# Patient Record
Sex: Male | Born: 1985 | Race: White | Hispanic: No | Marital: Married | Smoking: Current every day smoker
Health system: Southern US, Community
[De-identification: ages and names within clinical notes are randomized; demographics above are authoritative.]

## PROBLEM LIST (undated history)

## (undated) DIAGNOSIS — R12 Heartburn: Secondary | ICD-10-CM

## (undated) HISTORY — PX: FRACTURE SURGERY: SHX138

---
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
## 2006-07-05 ENCOUNTER — Emergency Department (HOSPITAL_COMMUNITY): Admission: EM | Admit: 2006-07-05 | Discharge: 2006-07-05 | Payer: Self-pay | Admitting: Emergency Medicine

## 2010-08-15 ENCOUNTER — Observation Stay (HOSPITAL_COMMUNITY)
Admission: AD | Admit: 2010-08-15 | Discharge: 2010-08-16 | Disposition: A | Discharge status: Home / Self Care | Payer: BC Managed Care – PPO | Source: Other Acute Inpatient Hospital | Attending: Internal Medicine | Admitting: Internal Medicine

## 2010-08-15 ENCOUNTER — Emergency Department (HOSPITAL_BASED_OUTPATIENT_CLINIC_OR_DEPARTMENT_OTHER)
Admission: EM | Admit: 2010-08-15 | Discharge: 2010-08-15 | Disposition: A | Discharge status: Nursing Home (Includes ICF) | Payer: BC Managed Care – PPO | Source: Home / Self Care | Attending: Emergency Medicine | Admitting: Emergency Medicine

## 2010-08-15 DIAGNOSIS — Y990 Civilian activity done for income or pay: Secondary | ICD-10-CM | POA: Insufficient documentation

## 2010-08-15 DIAGNOSIS — E860 Dehydration: Secondary | ICD-10-CM | POA: Insufficient documentation

## 2010-08-15 DIAGNOSIS — T673XXA Heat exhaustion, anhydrotic, initial encounter: Principal | ICD-10-CM | POA: Insufficient documentation

## 2010-08-15 DIAGNOSIS — Z01812 Encounter for preprocedural laboratory examination: Secondary | ICD-10-CM | POA: Insufficient documentation

## 2010-08-15 DIAGNOSIS — X30XXXA Exposure to excessive natural heat, initial encounter: Secondary | ICD-10-CM | POA: Insufficient documentation

## 2010-08-15 DIAGNOSIS — N179 Acute kidney failure, unspecified: Secondary | ICD-10-CM | POA: Insufficient documentation

## 2010-08-15 LAB — COMPREHENSIVE METABOLIC PANEL
ALT: 40 U/L (ref 0–53)
AST: 20 U/L (ref 0–37)
Albumin: 5.5 g/dL — ABNORMAL HIGH (ref 3.5–5.2)
Alkaline Phosphatase: 104 U/L (ref 39–117)
BUN: 39 mg/dL — ABNORMAL HIGH (ref 6–23)
CO2: 18 mEq/L — ABNORMAL LOW (ref 19–32)
Calcium: 9.7 mg/dL (ref 8.4–10.5)
Chloride: 96 mEq/L (ref 96–112)
Creatinine, Ser: 3.3 mg/dL — ABNORMAL HIGH (ref 0.50–1.35)
GFR calc Af Amer: 28 mL/min — ABNORMAL LOW (ref >60)
GFR calc non Af Amer: 23 mL/min — ABNORMAL LOW (ref >60)
Glucose, Bld: 141 mg/dL — ABNORMAL HIGH (ref 70–99)
Potassium: 3.5 mEq/L (ref 3.5–5.1)
Sodium: 136 mEq/L (ref 135–145)
Total Bilirubin: 0.4 mg/dL (ref 0.3–1.2)
Total Protein: 8.6 g/dL — ABNORMAL HIGH (ref 6.0–8.3)

## 2010-08-15 LAB — DIFFERENTIAL
Basophils Absolute: 0 10*3/uL (ref 0.0–0.1)
Basophils Relative: 0 % (ref 0–1)
Eosinophils Absolute: 0 10*3/uL (ref 0.0–0.7)
Eosinophils Relative: 0 % (ref 0–5)
Lymphocytes Relative: 5 % — ABNORMAL LOW (ref 12–46)
Lymphs Abs: 0.9 10*3/uL (ref 0.7–4.0)
Monocytes Absolute: 0.9 10*3/uL (ref 0.1–1.0)
Monocytes Relative: 5 % (ref 3–12)
Neutro Abs: 14.5 10*3/uL — ABNORMAL HIGH (ref 1.7–7.7)
Neutrophils Relative %: 89 % — ABNORMAL HIGH (ref 43–77)

## 2010-08-15 LAB — URINALYSIS, ROUTINE W REFLEX MICROSCOPIC
Glucose, UA: NEGATIVE mg/dL
Ketones, ur: 40 mg/dL — AB
Leukocytes, UA: NEGATIVE
Nitrite: NEGATIVE
Protein, ur: 100 mg/dL — AB
Specific Gravity, Urine: 1.024 (ref 1.005–1.030)
Urobilinogen, UA: 1 mg/dL (ref 0.0–1.0)
pH: 5.5 (ref 5.0–8.0)

## 2010-08-15 LAB — CBC
HCT: 46.2 % (ref 39.0–52.0)
Hemoglobin: 16.9 g/dL (ref 13.0–17.0)
MCH: 31.4 pg (ref 26.0–34.0)
MCHC: 36.6 g/dL — ABNORMAL HIGH (ref 30.0–36.0)
MCV: 85.7 fL (ref 78.0–100.0)
Platelets: 225 10*3/uL (ref 150–400)
RBC: 5.39 MIL/uL (ref 4.22–5.81)
RDW: 12.9 % (ref 11.5–15.5)
WBC: 16.2 10*3/uL — ABNORMAL HIGH (ref 4.0–10.5)

## 2010-08-15 LAB — URINE MICROSCOPIC-ADD ON

## 2010-08-15 LAB — CK: Total CK: 196 U/L (ref 7–232)

## 2010-08-16 LAB — CBC
HCT: 38.1 % — ABNORMAL LOW (ref 39.0–52.0)
Hemoglobin: 13.3 g/dL (ref 13.0–17.0)
RBC: 4.26 MIL/uL (ref 4.22–5.81)
WBC: 11.2 10*3/uL — ABNORMAL HIGH (ref 4.0–10.5)

## 2010-08-16 LAB — COMPREHENSIVE METABOLIC PANEL
Alkaline Phosphatase: 68 U/L (ref 39–117)
BUN: 25 mg/dL — ABNORMAL HIGH (ref 6–23)
Chloride: 106 mEq/L (ref 96–112)
GFR calc Af Amer: >60 mL/min (ref >60)
Glucose, Bld: 107 mg/dL — ABNORMAL HIGH (ref 70–99)
Potassium: 3.2 mEq/L — ABNORMAL LOW (ref 3.5–5.1)
Total Bilirubin: 0.4 mg/dL (ref 0.3–1.2)

## 2010-08-16 LAB — CK: Total CK: 390 U/L — ABNORMAL HIGH (ref 7–232)

## 2010-08-16 LAB — SODIUM, URINE, RANDOM: Sodium, Ur: 37 mEq/L

## 2010-08-17 LAB — DRUGS OF ABUSE SCREEN W/O ALC, ROUTINE URINE
Barbiturate Quant, Ur: NEGATIVE
Benzodiazepines.: NEGATIVE
Cocaine Metabolites: NEGATIVE
Marijuana Metabolite: POSITIVE — AB
Methadone: NEGATIVE
Opiate Screen, Urine: NEGATIVE

## 2010-08-19 NOTE — H&P (Signed)
NAMEELYAN, VANWIEREN                 ACCOUNT NO.:  [id number]  MEDICAL RECORD NO.:  [id number]  LOCATION:  3742                         FACILITY:  [facility]  PHYSICIAN:  Eduard Clos, MDDATE OF BIRTH:  November 20, 1985  DATE OF ADMISSION:  08/15/2010 DATE OF DISCHARGE:                             HISTORY & PHYSICAL   PRIMARY CARE PHYSICIAN:  Unassigned.  The patient does not have one.  CHIEF COMPLAINT:  Weakness.  HISTORY OF PRESENT ILLNESS:  A 25 year old male with no significant past medical history was brought into ER after the patient was found to be very weak.  The patient states that he usually works for [employer] and he has not worked for a year and started back again 3 days ago, he felt very tired, weak, started throwing up multiple times at least 3 times after his work and had gone home and while trying to get into his home he fell and almost lost his consciousness which happened again.  He felt very warm and hot sweating, so he fill the bathtub with ice water and he sat in it, then he called his mom and he was getting again spells of unconscious, feeling very tired and weak.  He did not bite his tongue or have any incontinence of urine to his knowledge, the whole body with aching and he also again vomited 2 or 3 times at home.  There is no blood in it.  No diarrhea.  He did have some abdominal cramps and he came to the ER.  In the ER, the patient was found to have creatinine of 3.3.  The patient does not have any previous history of  any renal problems.  At this time, it is felt that the patient may be having acute renal failure from his dehydration.  His creatine kinase was normal.  The patient denies any chest pain, shortness of breath.  Denies any headache or visual symptoms.  Did not lose function of the upper or lower extremity.  Did not have any diarrhea.  His abdominal pain was crampy in nature was episodic, which has completely resolved at this time.  His nausea  also has resolved.  He wants to eat something at this time.  PAST MEDICAL HISTORY:  Nothing significant.  MEDICATIONS ON ADMISSION:  He states he takes none.  SOCIAL HISTORY:  The patient smokes cigarettes, had been advised to quit smoking.  Drinks alcohol in the weekends, usually beer.  Denies any drug abuse.  He is married.  FAMILY HISTORY:  Negative for any diabetes, stroke, heart disease, cancer.  REVIEW OF SYSTEMS:  As per the history of present illness, nothing else significant.  PHYSICAL EXAMINATION:  GENERAL:  The patient examined at bedside, not in acute distress. VITAL SIGNS:  Blood pressure is 136/80, pulse is 86 per minute, temperature 98, respirations 18 per minute, O2 sat 97%. HEENT:  Anicteric.  No pallor.  No facial asymmetry.  Tongue is midline. No neck rigidity. CHEST:  Bilateral air entry present.  No rhonchi.  No crepitation. HEART:  S1 and S2 heard. ABDOMEN:  Soft, nontender.  Bowel sounds heard. CNS:  The patient is alert, awake and oriented to  time, place and person.  Moves upper and lower extremities, 5/5. EXTREMITIES:  Peripheral pulses felt.  No edema.  LABORATORY DATA:  CBC:  WBC 16.2, hemoglobin is 16.9, hematocrit is 46.2, platelets 225.  Complete metabolic panel, sodium 136, potassium 3.5, chloride 96, carbon dioxide 18, anion gap is 22, glucose 141, BUN 39, creatinine 3.3, total bilirubin is 0.4, alkaline phosphatase is 104, AST 20, ALT 40, total protein 8.6, albumin 5.5, calcium 9.7, CK is 196. UA is cloudy, negative glucose, bilirubin moderate, ketones 40, blood moderate, protein 100, nitrites negative, leukocytes negative, calcium oxalate crystals, hyaline casts, wbc's 3-6, rbc's 3-6, bacteria many, mucus present.  ASSESSMENT: 1. Acute renal failure. 2. Dehydration. 3. Nausea, vomiting and syncopal episodes, probably from acute renal     failure and dehydration reasons.  PLAN: 1. At this time, we will admit the patient to telemetry to  observe. 2. For his acute renal failure, at this time, I think it is from the     intense dehydration, nausea and vomiting.  At this time, his nausea     and vomiting has resolved.  He has no abdominal pain.  He actually     wants to eat something, I am going to start some clear liquid diet     and advance as tolerated.  We are going to hydrate the patient     aggressively with normal saline 250 mL per hour and closely follow     his intake and output.  I am also going to get an urine sodium and     creatinine.  I am going to recheck his labs in a.m.  He does have     significant leukocytosis, but at this time, he is afebrile, and     there is no definite clinical symptoms for any infective etiology.     We will recheck his CBC again in a.m. to make sure his leukocytosis     is improving.  At this time, I am not ordering any radiological     tests.  If his creatinine does not improve with adequate hydration,     we may consider further tests including sonogram of the kidneys.     We should keep a close watch on his leukocytosis also and further     recommendation based on test order and clinical course.     Eduard Clos, MD     ANK/MEDQ  D:  08/15/2010  T:  08/16/2010  Job:  [id number]  Electronically Signed by Midge Minium MD on 08/19/2010 07:37:28 AM

## 2010-08-28 NOTE — Discharge Summary (Signed)
  NAMEKENDRELL, [name]                 ACCOUNT NO.:  [id number]  MEDICAL RECORD NO.:  [id number]  LOCATION:  3742                         FACILITY:  [facility]  PHYSICIAN:  Osvaldo Shipper, MD     DATE OF BIRTH:  10-19-1985  DATE OF ADMISSION:  08/15/2010 DATE OF DISCHARGE:  08/16/2010                              DISCHARGE SUMMARY   The patient does not have a primary care physician.  No consultations obtained during this admission.  No imaging studies done.  DISCHARGE DIAGNOSES: 1. Acute renal failure due to dehydration improved. 2. Heat exhaustion improved.  BRIEF HOSPITAL COURSE:  Briefly, this is a 25 year old Caucasian male with no medical problems who was working outside in the sun yesterday and he got dizzy, lightheaded, started having nausea, vomiting. Apparently, he is working outdoors after a gap of more than a year.  So, the patient was thought to have some form of heat exhaustion.  When he was evaluated in the ED, he was found to have acute renal failure with a BUN of 39, creatinine of 3.3.  The patient was admitted to the hospital. He was started on IV fluids.  He was already feeling somewhat better while he was in the ED.  He was given a diet and the patient started improving.  His BUN is decreased to 25, creatinine is down to 1.11.  The patient is feeling better.  He has not had any nausea/vomiting.  He is tolerating p.o. intake.  He was hypokalemic this morning for which he has been given 40 mEq of KCl and he will be asked to take a couple of bananas later today.  So essentially, I think his renal failure was mainly because of dehydration from exposure to heat and excessive sweating.  I have told him to keep himself well hydrated with Gatorade over the next few days and even when he works outside in the future he should keep himself hydrated.  DISCHARGE MEDICATIONS:  None.  DIET:  Regular.  The patient instructed to have 2 bananas later today.  PHYSICAL ACTIVITY:   Increase activity slowly.  He may return to work on August 19, 2010.  TOTAL TIME ON THIS DISCHARGE ENCOUNTER:  35 minutes.     Osvaldo Shipper, MD     GK/MEDQ  D:  08/16/2010  T:  08/17/2010  Job:  [id number]  Electronically Signed by Osvaldo Shipper MD on 08/28/2010 07:30:00 PM

## 2010-08-30 ENCOUNTER — Emergency Department (HOSPITAL_COMMUNITY)
Admission: EM | Admit: 2010-08-30 | Discharge: 2010-08-30 | Disposition: A | Discharge status: Home / Self Care | Payer: BC Managed Care – PPO | Attending: Emergency Medicine | Admitting: Emergency Medicine

## 2010-08-30 DIAGNOSIS — X30XXXA Exposure to excessive natural heat, initial encounter: Secondary | ICD-10-CM | POA: Insufficient documentation

## 2010-08-30 DIAGNOSIS — T675XXA Heat exhaustion, unspecified, initial encounter: Secondary | ICD-10-CM | POA: Insufficient documentation

## 2010-08-30 DIAGNOSIS — IMO0001 Reserved for inherently not codable concepts without codable children: Secondary | ICD-10-CM | POA: Insufficient documentation

## 2010-08-30 DIAGNOSIS — R51 Headache: Secondary | ICD-10-CM | POA: Insufficient documentation

## 2010-08-30 DIAGNOSIS — N289 Disorder of kidney and ureter, unspecified: Secondary | ICD-10-CM | POA: Insufficient documentation

## 2010-08-30 DIAGNOSIS — Y93H2 Activity, gardening and landscaping: Secondary | ICD-10-CM | POA: Insufficient documentation

## 2010-08-30 DIAGNOSIS — R1110 Vomiting, unspecified: Secondary | ICD-10-CM | POA: Insufficient documentation

## 2010-08-30 DIAGNOSIS — E860 Dehydration: Secondary | ICD-10-CM | POA: Insufficient documentation

## 2010-08-30 LAB — POCT I-STAT, CHEM 8
BUN: 25 mg/dL — ABNORMAL HIGH (ref 6–23)
BUN: 19 mg/dL (ref 6–23)
Chloride: 106 mEq/L (ref 96–112)
Chloride: 107 mEq/L (ref 96–112)
Creatinine, Ser: 2.6 mg/dL — ABNORMAL HIGH (ref 0.50–1.35)
Creatinine, Ser: 1.6 mg/dL — ABNORMAL HIGH (ref 0.50–1.35)
Glucose, Bld: 111 mg/dL — ABNORMAL HIGH (ref 70–99)
Hemoglobin: 13.6 g/dL (ref 13.0–17.0)
Potassium: 3.9 mEq/L (ref 3.5–5.1)
Sodium: 138 mEq/L (ref 135–145)
TCO2: 22 mmol/L (ref 0–100)

## 2013-06-21 ENCOUNTER — Emergency Department (HOSPITAL_COMMUNITY): Payer: Worker's Compensation | Admitting: Certified Registered Nurse Anesthetist

## 2013-06-21 ENCOUNTER — Encounter (HOSPITAL_COMMUNITY)
Admission: EM | Disposition: A | Discharge status: Home / Self Care | Payer: Self-pay | Source: Home / Self Care | Attending: Orthopedic Surgery

## 2013-06-21 ENCOUNTER — Encounter (HOSPITAL_COMMUNITY): Payer: Worker's Compensation | Admitting: Certified Registered Nurse Anesthetist

## 2013-06-21 ENCOUNTER — Emergency Department (HOSPITAL_COMMUNITY): Payer: Worker's Compensation

## 2013-06-21 ENCOUNTER — Inpatient Hospital Stay (HOSPITAL_COMMUNITY)
Admission: EM | Admit: 2013-06-21 | Discharge: 2013-06-24 | DRG: 514 | Disposition: A | Discharge status: Home / Self Care | Payer: Worker's Compensation | Attending: Orthopedic Surgery | Admitting: Orthopedic Surgery

## 2013-06-21 ENCOUNTER — Encounter (HOSPITAL_COMMUNITY): Payer: Self-pay | Admitting: Emergency Medicine

## 2013-06-21 DIAGNOSIS — S6990XA Unspecified injury of unspecified wrist, hand and finger(s), initial encounter: Secondary | ICD-10-CM | POA: Diagnosis present

## 2013-06-21 DIAGNOSIS — Z79899 Other long term (current) drug therapy: Secondary | ICD-10-CM | POA: Diagnosis not present

## 2013-06-21 DIAGNOSIS — IMO0001 Reserved for inherently not codable concepts without codable children: Secondary | ICD-10-CM

## 2013-06-21 DIAGNOSIS — S6980XA Other specified injuries of unspecified wrist, hand and finger(s), initial encounter: Secondary | ICD-10-CM | POA: Diagnosis present

## 2013-06-21 DIAGNOSIS — W268XXA Contact with other sharp object(s), not elsewhere classified, initial encounter: Secondary | ICD-10-CM | POA: Diagnosis present

## 2013-06-21 DIAGNOSIS — IMO0002 Reserved for concepts with insufficient information to code with codable children: Principal | ICD-10-CM | POA: Diagnosis present

## 2013-06-21 DIAGNOSIS — Y990 Civilian activity done for income or pay: Secondary | ICD-10-CM

## 2013-06-21 DIAGNOSIS — S62619A Displaced fracture of proximal phalanx of unspecified finger, initial encounter for closed fracture: Secondary | ICD-10-CM | POA: Diagnosis present

## 2013-06-21 DIAGNOSIS — F17200 Nicotine dependence, unspecified, uncomplicated: Secondary | ICD-10-CM | POA: Diagnosis present

## 2013-06-21 HISTORY — DX: Heartburn: R12

## 2013-06-21 HISTORY — PX: NERVE, TENDON AND ARTERY REPAIR: SHX5695

## 2013-06-21 LAB — CBC WITH DIFFERENTIAL/PLATELET
BASOS ABS: 0 10*3/uL (ref 0.0–0.1)
Basophils Relative: 0 % (ref 0–1)
Eosinophils Absolute: 0 10*3/uL (ref 0.0–0.7)
Eosinophils Relative: 0 % (ref 0–5)
HCT: 44 % (ref 39.0–52.0)
Hemoglobin: 14.9 g/dL (ref 13.0–17.0)
LYMPHS PCT: 9 % — AB (ref 12–46)
Lymphs Abs: 1.1 10*3/uL (ref 0.7–4.0)
MCH: 31.4 pg (ref 26.0–34.0)
MCHC: 33.9 g/dL (ref 30.0–36.0)
MCV: 92.6 fL (ref 78.0–100.0)
Monocytes Absolute: 0.7 10*3/uL (ref 0.1–1.0)
Monocytes Relative: 6 % (ref 3–12)
NEUTROS ABS: 10.4 10*3/uL — AB (ref 1.7–7.7)
NEUTROS PCT: 85 % — AB (ref 43–77)
PLATELETS: 192 10*3/uL (ref 150–400)
RBC: 4.75 MIL/uL (ref 4.22–5.81)
RDW: 13.1 % (ref 11.5–15.5)
WBC: 12.2 10*3/uL — AB (ref 4.0–10.5)

## 2013-06-21 LAB — BASIC METABOLIC PANEL
BUN: 15 mg/dL (ref 6–23)
CALCIUM: 9.3 mg/dL (ref 8.4–10.5)
CHLORIDE: 99 meq/L (ref 96–112)
CO2: 24 meq/L (ref 19–32)
Creatinine, Ser: 0.65 mg/dL (ref 0.50–1.35)
GFR calc Af Amer: >90 mL/min (ref >90)
GFR calc non Af Amer: >90 mL/min (ref >90)
Glucose, Bld: 126 mg/dL — ABNORMAL HIGH (ref 70–99)
POTASSIUM: 4.2 meq/L (ref 3.7–5.3)
SODIUM: 137 meq/L (ref 137–147)

## 2013-06-21 SURGERY — NERVE, TENDON AND ARTERY REPAIR
Anesthesia: General | Site: Finger | Laterality: Left

## 2013-06-21 MED ORDER — LACTATED RINGERS IV SOLN
INTRAVENOUS | Status: DC | PRN
  Administered 2013-06-21: 17:00:00 via INTRAVENOUS

## 2013-06-21 MED ORDER — ONDANSETRON HCL 4 MG/2ML IJ SOLN
INTRAMUSCULAR | Status: DC | PRN
  Administered 2013-06-21: 4 mg via INTRAVENOUS

## 2013-06-21 MED ORDER — MORPHINE SULFATE 4 MG/ML IJ SOLN
4.0000 mg | Freq: Once | INTRAMUSCULAR | Status: AC
  Administered 2013-06-21: 4 mg via INTRAVENOUS
  Filled 2013-06-21: qty 1

## 2013-06-21 MED ORDER — DEXTRAN 40 IN D5W 10 % IV SOLN
8.3000 mL/h | INTRAVENOUS | Status: AC
  Administered 2013-06-21: 8 mL/h via INTRAVENOUS
  Filled 2013-06-21: qty 500

## 2013-06-21 MED ORDER — ONDANSETRON HCL 4 MG/2ML IJ SOLN
INTRAMUSCULAR | Status: AC
  Filled 2013-06-21: qty 2

## 2013-06-21 MED ORDER — METHOCARBAMOL 100 MG/ML IJ SOLN
500.0000 mg | Freq: Four times a day (QID) | INTRAMUSCULAR | Status: DC | PRN
  Filled 2013-06-21: qty 5

## 2013-06-21 MED ORDER — CEFAZOLIN SODIUM 1-5 GM-% IV SOLN: 1.0000 g | INTRAVENOUS | Status: DC

## 2013-06-21 MED ORDER — LACTATED RINGERS IV SOLN: INTRAVENOUS | Status: DC

## 2013-06-21 MED ORDER — MIDAZOLAM HCL 2 MG/2ML IJ SOLN
INTRAMUSCULAR | Status: AC
  Filled 2013-06-21: qty 2

## 2013-06-21 MED ORDER — OXYCODONE HCL 5 MG PO TABS
ORAL_TABLET | ORAL | Status: AC
  Filled 2013-06-21: qty 1

## 2013-06-21 MED ORDER — TETANUS-DIPHTH-ACELL PERTUSSIS 5-2.5-18.5 LF-MCG/0.5 IM SUSP
0.5000 mL | Freq: Once | INTRAMUSCULAR | Status: AC
  Administered 2013-06-21: 0.5 mL via INTRAMUSCULAR
  Filled 2013-06-21: qty 0.5

## 2013-06-21 MED ORDER — ROCURONIUM BROMIDE 100 MG/10ML IV SOLN
INTRAVENOUS | Status: DC | PRN
  Administered 2013-06-21: 40 mg via INTRAVENOUS

## 2013-06-21 MED ORDER — NEOSTIGMINE METHYLSULFATE 1 MG/ML IJ SOLN
INTRAMUSCULAR | Status: AC
  Filled 2013-06-21: qty 10

## 2013-06-21 MED ORDER — PROPOFOL 10 MG/ML IV BOLUS
INTRAVENOUS | Status: DC | PRN
  Administered 2013-06-21: 180 mg via INTRAVENOUS
  Administered 2013-06-21: 20 mg via INTRAVENOUS
  Administered 2013-06-21: 30 mg via INTRAVENOUS

## 2013-06-21 MED ORDER — ASPIRIN 325 MG PO TABS
325.0000 mg | ORAL_TABLET | Freq: Two times a day (BID) | ORAL | Status: DC
  Filled 2013-06-21: qty 1

## 2013-06-21 MED ORDER — DEXAMETHASONE SODIUM PHOSPHATE 4 MG/ML IJ SOLN
INTRAMUSCULAR | Status: DC | PRN
  Administered 2013-06-21: 4 mg via INTRAVENOUS

## 2013-06-21 MED ORDER — GLYCOPYRROLATE 0.2 MG/ML IJ SOLN
INTRAMUSCULAR | Status: AC
  Filled 2013-06-21: qty 2

## 2013-06-21 MED ORDER — HYDROMORPHONE HCL PF 1 MG/ML IJ SOLN
0.2500 mg | INTRAMUSCULAR | Status: DC | PRN
Start: 2013-06-21 — End: 2013-06-21
  Administered 2013-06-21 (×2): 0.5 mg via INTRAVENOUS

## 2013-06-21 MED ORDER — GLYCOPYRROLATE 0.2 MG/ML IJ SOLN
INTRAMUSCULAR | Status: DC | PRN
  Administered 2013-06-21: 0.4 mg via INTRAVENOUS

## 2013-06-21 MED ORDER — SODIUM CHLORIDE 0.9 % IR SOLN
Status: DC | PRN
  Administered 2013-06-21 (×3): 1000 mL

## 2013-06-21 MED ORDER — LIDOCAINE HCL (CARDIAC) 20 MG/ML IV SOLN
INTRAVENOUS | Status: DC | PRN
  Administered 2013-06-21: 100 mg via INTRAVENOUS

## 2013-06-21 MED ORDER — HEPARIN SODIUM (PORCINE) 1000 UNIT/ML IJ SOLN
INTRAMUSCULAR | Status: AC
  Filled 2013-06-21: qty 1

## 2013-06-21 MED ORDER — CEFAZOLIN SODIUM 1-5 GM-% IV SOLN
1.0000 g | Freq: Once | INTRAVENOUS | Status: AC
  Administered 2013-06-21: 1 g via INTRAVENOUS
  Filled 2013-06-21: qty 50

## 2013-06-21 MED ORDER — METHOCARBAMOL 500 MG PO TABS
500.0000 mg | ORAL_TABLET | Freq: Four times a day (QID) | ORAL | Status: DC | PRN
  Administered 2013-06-22 – 2013-06-24 (×4): 500 mg via ORAL
  Filled 2013-06-21 (×4): qty 1

## 2013-06-21 MED ORDER — NEOSTIGMINE METHYLSULFATE 1 MG/ML IJ SOLN
INTRAMUSCULAR | Status: DC | PRN
  Administered 2013-06-21: 3 mg via INTRAVENOUS

## 2013-06-21 MED ORDER — LIDOCAINE HCL (CARDIAC) 20 MG/ML IV SOLN
INTRAVENOUS | Status: AC
  Filled 2013-06-21: qty 5

## 2013-06-21 MED ORDER — DEXAMETHASONE SODIUM PHOSPHATE 4 MG/ML IJ SOLN
INTRAMUSCULAR | Status: AC
  Filled 2013-06-21: qty 1

## 2013-06-21 MED ORDER — OXYCODONE HCL 5 MG PO TABS
5.0000 mg | ORAL_TABLET | ORAL | Status: DC | PRN
  Administered 2013-06-21 – 2013-06-23 (×10): 10 mg via ORAL
  Administered 2013-06-24: 5 mg via ORAL
  Administered 2013-06-24: 10 mg via ORAL
  Administered 2013-06-24: 5 mg via ORAL
  Filled 2013-06-21: qty 1
  Filled 2013-06-21 (×6): qty 2
  Filled 2013-06-21: qty 1
  Filled 2013-06-21 (×5): qty 2

## 2013-06-21 MED ORDER — HYDROMORPHONE HCL PF 1 MG/ML IJ SOLN
INTRAMUSCULAR | Status: AC
  Filled 2013-06-21: qty 1

## 2013-06-21 MED ORDER — CEFAZOLIN SODIUM 1-5 GM-% IV SOLN
1.0000 g | Freq: Three times a day (TID) | INTRAVENOUS | Status: DC
  Administered 2013-06-22 – 2013-06-24 (×7): 1 g via INTRAVENOUS
  Filled 2013-06-21 (×15): qty 50

## 2013-06-21 MED ORDER — ONDANSETRON HCL 4 MG/2ML IJ SOLN
4.0000 mg | Freq: Once | INTRAMUSCULAR | Status: AC
  Administered 2013-06-21: 4 mg via INTRAVENOUS
  Filled 2013-06-21: qty 2

## 2013-06-21 MED ORDER — ROCURONIUM BROMIDE 50 MG/5ML IV SOLN
INTRAVENOUS | Status: AC
  Filled 2013-06-21: qty 1

## 2013-06-21 MED ORDER — ONDANSETRON HCL 4 MG/2ML IJ SOLN
4.0000 mg | Freq: Four times a day (QID) | INTRAMUSCULAR | Status: DC | PRN
Start: 2013-06-21 — End: 2013-06-24

## 2013-06-21 MED ORDER — LIDOCAINE HCL (PF) 1 % IJ SOLN
INTRAMUSCULAR | Status: AC
  Filled 2013-06-21: qty 30

## 2013-06-21 MED ORDER — MIDAZOLAM HCL 5 MG/5ML IJ SOLN
INTRAMUSCULAR | Status: DC | PRN
  Administered 2013-06-21: 2 mg via INTRAVENOUS

## 2013-06-21 MED ORDER — OXYCODONE HCL 5 MG PO TABS
5.0000 mg | ORAL_TABLET | Freq: Once | ORAL | Status: AC | PRN
  Administered 2013-06-21: 5 mg via ORAL

## 2013-06-21 MED ORDER — MORPHINE SULFATE 2 MG/ML IJ SOLN
1.0000 mg | INTRAMUSCULAR | Status: DC | PRN
  Administered 2013-06-22 – 2013-06-24 (×6): 1 mg via INTRAVENOUS
  Filled 2013-06-21 (×6): qty 1

## 2013-06-21 MED ORDER — PROPOFOL 10 MG/ML IV BOLUS
INTRAVENOUS | Status: AC
  Filled 2013-06-21: qty 20

## 2013-06-21 MED ORDER — LACTATED RINGERS IV SOLN
INTRAVENOUS | Status: DC
  Administered 2013-06-21: 17:00:00 via INTRAVENOUS

## 2013-06-21 MED ORDER — ASPIRIN 325 MG PO TABS: 325.0000 mg | ORAL_TABLET | Freq: Every day | ORAL | Status: DC

## 2013-06-21 MED ORDER — OXYCODONE HCL 5 MG/5ML PO SOLN: 5.0000 mg | Freq: Once | ORAL | Status: AC | PRN

## 2013-06-21 MED ORDER — BUPIVACAINE HCL 0.5 % IJ SOLN
50.0000 mL | Freq: Once | INTRAMUSCULAR | Status: AC
  Administered 2013-06-21: 7 mL
  Filled 2013-06-21: qty 50

## 2013-06-21 MED ORDER — CEFAZOLIN SODIUM-DEXTROSE 2-3 GM-% IV SOLR
INTRAVENOUS | Status: AC
  Administered 2013-06-21: 2 g via INTRAVENOUS
  Filled 2013-06-21: qty 50

## 2013-06-21 MED ORDER — PANTOPRAZOLE SODIUM 40 MG PO TBEC
40.0000 mg | DELAYED_RELEASE_TABLET | Freq: Every day | ORAL | Status: DC
  Administered 2013-06-21 – 2013-06-24 (×4): 40 mg via ORAL
  Filled 2013-06-21 (×3): qty 1

## 2013-06-21 MED ORDER — ASPIRIN 325 MG PO TABS
325.0000 mg | ORAL_TABLET | Freq: Two times a day (BID) | ORAL | Status: DC
  Administered 2013-06-21 – 2013-06-24 (×6): 325 mg via ORAL
  Filled 2013-06-21 (×7): qty 1

## 2013-06-21 MED ORDER — BUPIVACAINE HCL (PF) 0.25 % IJ SOLN
INTRAMUSCULAR | Status: AC
  Filled 2013-06-21: qty 30

## 2013-06-21 MED ORDER — SENNA 8.6 MG PO TABS
1.0000 | ORAL_TABLET | Freq: Two times a day (BID) | ORAL | Status: DC
  Administered 2013-06-21 – 2013-06-24 (×6): 8.6 mg via ORAL
  Filled 2013-06-21 (×7): qty 1

## 2013-06-21 MED ORDER — FENTANYL CITRATE 0.05 MG/ML IJ SOLN
INTRAMUSCULAR | Status: DC | PRN
  Administered 2013-06-21 (×2): 100 ug via INTRAVENOUS
  Administered 2013-06-21: 50 ug via INTRAVENOUS

## 2013-06-21 MED ORDER — SUCCINYLCHOLINE CHLORIDE 20 MG/ML IJ SOLN: INTRAMUSCULAR | Status: DC | PRN

## 2013-06-21 MED ORDER — ONDANSETRON HCL 4 MG PO TABS: 4.0000 mg | ORAL_TABLET | Freq: Four times a day (QID) | ORAL | Status: DC | PRN

## 2013-06-21 MED ORDER — VITAMIN C 500 MG PO TABS
1000.0000 mg | ORAL_TABLET | Freq: Every day | ORAL | Status: DC
  Administered 2013-06-22 – 2013-06-24 (×3): 1000 mg via ORAL
  Filled 2013-06-21 (×3): qty 2

## 2013-06-21 MED ORDER — ONDANSETRON HCL 4 MG/2ML IJ SOLN
4.0000 mg | Freq: Four times a day (QID) | INTRAMUSCULAR | Status: AC | PRN
  Administered 2013-06-21: 4 mg via INTRAVENOUS

## 2013-06-21 MED ORDER — FENTANYL CITRATE 0.05 MG/ML IJ SOLN
INTRAMUSCULAR | Status: AC
  Filled 2013-06-21: qty 5

## 2013-06-21 MED ORDER — ALPRAZOLAM 0.5 MG PO TABS: 0.5000 mg | ORAL_TABLET | Freq: Four times a day (QID) | ORAL | Status: DC | PRN

## 2013-06-21 MED ORDER — DIPHENHYDRAMINE HCL 25 MG PO CAPS
25.0000 mg | ORAL_CAPSULE | Freq: Four times a day (QID) | ORAL | Status: DC | PRN
Start: 2013-06-21 — End: 2013-06-24

## 2013-06-21 SURGICAL SUPPLY — 61 items
BANDAGE ELASTIC 3 VELCRO ST LF (GAUZE/BANDAGES/DRESSINGS) ×2 IMPLANT
BANDAGE ELASTIC 4 VELCRO ST LF (GAUZE/BANDAGES/DRESSINGS) ×4 IMPLANT
BANDAGE GAUZE ELAST BULKY 4 IN (GAUZE/BANDAGES/DRESSINGS) ×4 IMPLANT
BLADE 10 SAFETY STRL DISP (BLADE) ×3 IMPLANT
BNDG COHESIVE 1X5 TAN STRL LF (GAUZE/BANDAGES/DRESSINGS) IMPLANT
BNDG COHESIVE 3X5 TAN STRL LF (GAUZE/BANDAGES/DRESSINGS) ×2 IMPLANT
CAP PIN PROTECTOR ORTHO WHT (CAP) ×2 IMPLANT
CORDS BIPOLAR (ELECTRODE) ×3 IMPLANT
COVER SURGICAL LIGHT HANDLE (MISCELLANEOUS) ×3 IMPLANT
CUFF TOURNIQUET SINGLE 18IN (TOURNIQUET CUFF) ×3 IMPLANT
CUFF TOURNIQUET SINGLE 24IN (TOURNIQUET CUFF) IMPLANT
DECANTER SPIKE VIAL GLASS SM (MISCELLANEOUS) ×3 IMPLANT
DRAPE CAMERA CLOSED 9X96 (DRAPES) ×2 IMPLANT
DRAPE OEC MINIVIEW 54X84 (DRAPES) ×2 IMPLANT
DRAPE SURG 17X23 STRL (DRAPES) ×3 IMPLANT
DRSG ADAPTIC 3X8 NADH LF (GAUZE/BANDAGES/DRESSINGS) ×2 IMPLANT
DRSG EMULSION OIL 3X3 NADH (GAUZE/BANDAGES/DRESSINGS) ×2 IMPLANT
GAUZE SPONGE 2X2 8PLY STRL LF (GAUZE/BANDAGES/DRESSINGS) IMPLANT
GAUZE XEROFORM 1X8 LF (GAUZE/BANDAGES/DRESSINGS) ×2 IMPLANT
GLOVE BIO SURGEON STRL SZ8 (GLOVE) ×2 IMPLANT
GLOVE BIOGEL M STRL SZ7.5 (GLOVE) ×3 IMPLANT
GLOVE BIOGEL PI IND STRL 8 (GLOVE) IMPLANT
GLOVE BIOGEL PI INDICATOR 8 (GLOVE) ×2
GLOVE SS BIOGEL STRL SZ 8 (GLOVE) ×1 IMPLANT
GLOVE SUPERSENSE BIOGEL SZ 8 (GLOVE) ×2
GOWN STRL REUS W/ TWL LRG LVL3 (GOWN DISPOSABLE) ×2 IMPLANT
GOWN STRL REUS W/ TWL XL LVL3 (GOWN DISPOSABLE) ×3 IMPLANT
GOWN STRL REUS W/TWL LRG LVL3 (GOWN DISPOSABLE) ×6
GOWN STRL REUS W/TWL XL LVL3 (GOWN DISPOSABLE) ×9
K-WIRE DBL TROCAR .045X4 ×6 IMPLANT
KIT BASIN OR (CUSTOM PROCEDURE TRAY) ×3 IMPLANT
KIT ROOM TURNOVER OR (KITS) ×3 IMPLANT
KWIRE DBL TROCAR .045X4 IMPLANT
LOOP VESSEL MAXI BLUE (MISCELLANEOUS) IMPLANT
MANIFOLD NEPTUNE II (INSTRUMENTS) ×3 IMPLANT
NDL HYPO 25GX1X1/2 BEV (NEEDLE) IMPLANT
NEEDLE HYPO 25GX1X1/2 BEV (NEEDLE) IMPLANT
NS IRRIG 1000ML POUR BTL (IV SOLUTION) ×3 IMPLANT
PACK ORTHO EXTREMITY (CUSTOM PROCEDURE TRAY) ×3 IMPLANT
PAD ARMBOARD 7.5X6 YLW CONV (MISCELLANEOUS) ×6 IMPLANT
PAD CAST 4YDX4 CTTN HI CHSV (CAST SUPPLIES) IMPLANT
PADDING CAST COTTON 4X4 STRL (CAST SUPPLIES) ×3
SOLUTION BETADINE 4OZ (MISCELLANEOUS) ×3 IMPLANT
SPEAR EYE SURG WECK-CEL (MISCELLANEOUS) IMPLANT
SPECIMEN JAR SMALL (MISCELLANEOUS) ×3 IMPLANT
SPLINT FIBERGLASS 3X12 (CAST SUPPLIES) ×2 IMPLANT
SPONGE GAUZE 2X2 STER 10/PKG (GAUZE/BANDAGES/DRESSINGS)
SPONGE GAUZE 4X4 12PLY (GAUZE/BANDAGES/DRESSINGS) ×4 IMPLANT
SPONGE SCRUB IODOPHOR (GAUZE/BANDAGES/DRESSINGS) ×3 IMPLANT
SUCTION FRAZIER TIP 10 FR DISP (SUCTIONS) ×2 IMPLANT
SUT MERSILENE 4 0 P 3 (SUTURE) IMPLANT
SUT PROLENE 4 0 PS 2 18 (SUTURE) IMPLANT
SUT VIC AB 2-0 CT1 27 (SUTURE)
SUT VIC AB 2-0 CT1 TAPERPNT 27 (SUTURE) IMPLANT
SYR CONTROL 10ML LL (SYRINGE) IMPLANT
TOWEL OR 17X24 6PK STRL BLUE (TOWEL DISPOSABLE) ×3 IMPLANT
TOWEL OR 17X26 10 PK STRL BLUE (TOWEL DISPOSABLE) ×3 IMPLANT
TUBE CONNECTING 12'X1/4 (SUCTIONS)
TUBE CONNECTING 12X1/4 (SUCTIONS) IMPLANT
UNDERPAD 30X30 INCONTINENT (UNDERPADS AND DIAPERS) ×3 IMPLANT
WATER STERILE IRR 1000ML POUR (IV SOLUTION) ×3 IMPLANT

## 2013-06-21 NOTE — ED Notes (Signed)
Pt fully undressed- belongings with family.

## 2013-06-21 NOTE — Op Note (Signed)
See dictation #[id number] Amanda PeaGramig MD

## 2013-06-21 NOTE — Progress Notes (Signed)
Orthopedic Tech Progress Note Patient Details:  [name] 13-Dec-1985 [id number]  Patient is placed in finger traps to maintain tension traction on the index finger.   [name] 06/21/2013, 2:55 PM

## 2013-06-21 NOTE — Anesthesia Procedure Notes (Addendum)
Procedure Name: Intubation Date/Time: 06/21/2013 6:29 PM Performed by: Sarita HaverFLOWERS, Amit Meloy T Pre-anesthesia Checklist: Patient identified, Timeout performed, Emergency Drugs available, Suction available and Patient being monitored Patient Re-evaluated:Patient Re-evaluated prior to inductionOxygen Delivery Method: Circle system utilized and Simple face mask Preoxygenation: Pre-oxygenation with 100% oxygen Intubation Type: IV induction Ventilation: Mask ventilation without difficulty Laryngoscope Size: Miller and 3 Grade View: Grade II Tube type: Oral Tube size: 7.5 mm Number of attempts: 1 Airway Equipment and Method: Patient positioned with wedge pillow and Stylet Placement Confirmation: ETT inserted through vocal cords under direct vision,  positive ETCO2 and breath sounds checked- equal and bilateral Secured at: 21 cm Tube secured with: Tape Dental Injury: Teeth and Oropharynx as per pre-operative assessment    Performed by: Reine JustFLOWERS, Zhoey Blackstock T

## 2013-06-21 NOTE — ED Provider Notes (Signed)
Medical screening examination/treatment/procedure(s) were conducted as a shared visit with non-physician practitioner(s) and myself.  I personally evaluated the patient during the encounter.   EKG Interpretation None      Patient presents following a finger injury. He is left-handed. Injury noted to the left index finger. Patient reports that a rate caught around his finger. Finger noted to be dusky with poor Refill and decreased sensation. Obvious deformity to the proximal phalanx. X-ray confirms fracture.  Fracture was reduced with improved capillary refill and coloration. Hand surgery consulted.  Patient to OR.  Shon Batonourtney F Kristena Wilhelmi, MD 06/22/13 [phone]

## 2013-06-21 NOTE — Anesthesia Preprocedure Evaluation (Addendum)
Anesthesia Evaluation  Patient identified by MRN, date of birth, ID band Patient awake    Reviewed: Allergy & Precautions, H&P , NPO status , Patient's Chart, lab work & pertinent test results, reviewed documented beta blocker date and time   Airway Mallampati: II TM Distance: >3 FB Neck ROM: Full    Dental  (+) Dental Advisory Given,    Pulmonary Current Smoker,  Pt states stopped smoking a month ago breath sounds clear to auscultation        Cardiovascular negative cardio ROS  Rhythm:Regular Rate:Normal     Neuro/Psych    GI/Hepatic   Endo/Other    Renal/GU      Musculoskeletal   Abdominal   Peds  Hematology   Anesthesia Other Findings   Reproductive/Obstetrics                         Anesthesia Physical Anesthesia Plan  ASA: II and emergent  Anesthesia Plan: General   Post-op Pain Management:    Induction: Intravenous  Airway Management Planned: Oral ETT  Additional Equipment:   Intra-op Plan:   Post-operative Plan: Extubation in OR  Informed Consent: I have reviewed the patients History and Physical, chart, labs and discussed the procedure including the risks, benefits and alternatives for the proposed anesthesia with the patient or authorized representative who has indicated his/her understanding and acceptance.   Dental advisory given  Plan Discussed with: CRNA and Surgeon  Anesthesia Plan Comments:        Anesthesia Quick Evaluation

## 2013-06-21 NOTE — Anesthesia Postprocedure Evaluation (Signed)
  Anesthesia Post-op Note  Patient: [name]  Procedure(s) Performed: Procedure(s): Incision and Drainage with Exploration left index finger , partial nail plate removal Percutaneous Fixation Left Index Finger (Left)  Patient Location: PACU  Anesthesia Type:General  Level of Consciousness: awake  Airway and Oxygen Therapy: Patient Spontanous Breathing  Post-op Pain: mild  Post-op Assessment: Post-op Vital signs reviewed, Patient's Cardiovascular Status Stable, Respiratory Function Stable, Patent Airway, No signs of Nausea or vomiting and Pain level controlled  Post-op Vital Signs: Reviewed and stable  Last Vitals:  Filed Vitals:   06/21/13 1945  BP: 129/85  Pulse: 75  Temp:   Resp: 16    Complications: No apparent anesthesia complications

## 2013-06-21 NOTE — ED Notes (Signed)
Consent has been signed- pt undressed, belongings sent with friend.

## 2013-06-21 NOTE — ED Notes (Signed)
Pt placed in finger traps

## 2013-06-21 NOTE — ED Provider Notes (Signed)
CSN: [id number]     Arrival date & time 06/21/13  1315 History   First MD Initiated Contact with Patient 06/21/13 1338     Chief Complaint  Patient presents with  . Finger Injury     (Consider location/radiation/quality/duration/timing/severity/associated sxs/prior Treatment) HPI [name] is a 28 y.o. male who presents to ED with a fishing rope injury to the left index finger. Pt states rope wrapped and pulled on the finger. States now with deformity, pain, numbness, discoloration to the finger. Pt unsure of last tetanus. No other injuries. Unable to use finger. Pt is left handed.   Past Medical History  Diagnosis Date  . Heartburn    Past Surgical History  Procedure Laterality Date  . Fracture surgery     No family history on file. History  Substance Use Topics  . Smoking status: Current Every Day Smoker  . Smokeless tobacco: Not on file  . Alcohol Use: Yes    Review of Systems  Constitutional: Negative for fever and chills.  Respiratory: Negative.   Cardiovascular: Negative.   Musculoskeletal: Positive for arthralgias and joint swelling.  Skin: Positive for wound.  Neurological: Positive for weakness and numbness.  All other systems reviewed and are negative.     Allergies  Review of patient's allergies indicates no known allergies.  Home Medications   Prior to Admission medications   Medication Sig Start Date End Date Taking? Authorizing Provider  omeprazole (PRILOSEC) 20 MG capsule Take 20 mg by mouth daily.   Yes Historical Provider, MD   BP 128/87  Pulse 114  Temp(Src) 98.9 F (37.2 C) (Oral)  Resp 14  Ht 5\' 11"  (1.803 m)  Wt 168 lb (76.204 kg)  BMI 23.44 kg/m2  SpO2 97% Physical Exam  Nursing note and vitals reviewed. Constitutional: He is oriented to person, place, and time. He appears well-developed and well-nourished. No distress.  HENT:  Head: Normocephalic and atraumatic.  Eyes: Conjunctivae are normal.  Neck: Neck supple.   Cardiovascular: Normal rate, regular rhythm and normal heart sounds.   Pulmonary/Chest: Effort normal. No respiratory distress. He has no wheezes. He has no rales.  Musculoskeletal: He exhibits no edema.  Obvious deformity to the proximal phalanx of the left index finger. Open skin present. Tender to palpation. Decreased sensation distal to the injury dorsally and ventrally. Finger is dusky blue, no cap refill 3 sec. Unable to move due to pain and deformity  Neurological: He is alert and oriented to person, place, and time.  Skin: Skin is warm and dry.    ED Course  Procedures (including critical care time) Labs Review Labs Reviewed  CBC WITH DIFFERENTIAL - Abnormal; Notable for the following:    WBC 12.2 (*)    Neutrophils Relative % 85 (*)    Neutro Abs 10.4 (*)    Lymphocytes Relative 9 (*)    All other components within normal limits  BASIC METABOLIC PANEL - Abnormal; Notable for the following:    Glucose, Bld 126 (*)    All other components within normal limits    Imaging Review Dg Finger Index Left  06/21/2013   CLINICAL DATA:  Pain and swelling secondary to trauma.  EXAM: LEFT INDEX FINGER 2+V  COMPARISON:  Radiograph dated 05/13/2013  FINDINGS: There is a comminuted angulated distracted fracture of the proximal phalanx of the index finger. There is a spiral component of the fractured extending toward the base.  IMPRESSION: Comminuted fracture of the proximal phalanx of the index finger.  Electronically Signed   By: Geanie CooleyJim  Maxwell M.D.   On: 06/21/2013 14:16     EKG Interpretation None      MDM   Final diagnoses:  None   Reduction of dislocation Date/Time: 3:08 PM Performed by: Tegh Franek A Nkenge Sonntag and Dr. Wilkie AyeHorton Authorized by: Myriam Jacobsonatyana A Adaly Puder Consent: Verbal consent obtained. Risks and benefits: risks, benefits and alternatives were discussed Consent given by: patient Required items: required blood products, implants, devices, and special equipment  available Time out: Immediately prior to procedure a "time out" was called to verify the correct patient, procedure, equipment, support staff and site/side marked as required.  Patient sedated: no  Vitals: Vital signs were monitored during sedation. Patient tolerance: Patient tolerated the procedure well with no immediate complications. Joint: left index finger fracture Reduction technique: traction countertraction  NERVE BLOCK Performed by: Myriam Jacobsonatyana A Kentavius Dettore Consent: Verbal consent obtained. Required items: required blood products, implants, devices, and special equipment available Time out: Immediately prior to procedure a "time out" was called to verify the correct patient, procedure, equipment, support staff and site/side marked as required.  Indication: finger fracture reduction Nerve block body site: left index finger  Preparation: Patient was prepped and draped in the usual sterile fashion. Needle gauge: 25 G Location technique: anatomical landmarks  Local anesthetic: bupivacaine  Anesthetic total: 5 ml  Outcome: pain improved Patient tolerance: Patient tolerated the procedure well with no immediate complications.    Pt's initial exam showed dusky finger with poor blood supply, decreased sensation. Nerve block administered, reduced with Dr. Wilkie AyeHorton, with pink color return to the distal finger. Will give ancef for open fx  Discussed with Dr. Amanda PeaGramig, will come see. Pt's finger placed in a splint.      Lottie Musselatyana A Reyaan Thoma, PA-C 06/21/13 1626

## 2013-06-21 NOTE — ED Notes (Signed)
Pt removed from finger traps by PA per hand surgery.

## 2013-06-21 NOTE — ED Notes (Addendum)
Pt states a cable with 280lb weight got tangled on L index finger.  Finger with obvious deformity and laceration.  Scant amount of bleeding.  Chuck applied around hand.  Pt alert and oriented.

## 2013-06-21 NOTE — Transfer of Care (Signed)
Immediate Anesthesia Transfer of Care Note  Patient: [name]  Procedure(s) Performed: Procedure(s): Incision and Drainage with Exploration left index finger , partial nail plate removal Percutaneous Fixation Left Index Finger (Left)  Patient Location: PACU  Anesthesia Type:General  Level of Consciousness: awake, alert , oriented and patient cooperative  Airway & Oxygen Therapy: Patient Spontanous Breathing and Patient connected to nasal cannula oxygen  Post-op Assessment: Report given to PACU RN, Post -op Vital signs reviewed and stable and Patient moving all extremities X 4  Post vital signs: Reviewed and stable  Complications: No apparent anesthesia complications

## 2013-06-21 NOTE — ED Notes (Signed)
Hand specialist at bedside

## 2013-06-21 NOTE — H&P (Signed)
[name] is an 28 y.o. male.   Chief Complaint: left index finger fracture with loss of blood flow HPI: Patient is status post ringer injury to the left index finger with loss of blood flow. He has an fracture that appears to be open. He has previously been seen and underwent a block by the emergency staff fall by attempted reduction. His fingers remained dysvascular.  I was asked to see him. Upon seeing him we immediately booked him for surgery.  I discussed him unfortunately this is a very serious injury.  One that has a high tendency towards loss of digit.  This was an on-the-job injury. He works for [employer]  He denies neck back chest or abdominal pain.  I have a great fear that this may go onto an amputation given the tremendous loss of blood flow and tremendous disarray of the finger.  Past Medical History  Diagnosis Date  . Heartburn     Past Surgical History  Procedure Laterality Date  . Fracture surgery      No family history on file. Social History:  reports that he has been smoking.  He does not have any smokeless tobacco history on file. He reports that he drinks alcohol. He reports that he does not use illicit drugs.  Allergies: No Known Allergies  Medications Prior to Admission  Medication Sig Dispense Refill  . omeprazole (PRILOSEC) 20 MG capsule Take 20 mg by mouth daily.        Results for orders placed during the hospital encounter of 06/21/13 (from the past 48 hour(s))  CBC WITH DIFFERENTIAL     Status: Abnormal   Collection Time    06/21/13  1:45 PM      Result Value Ref Range   WBC 12.2 (*) 4.0 - 10.5 K/uL   RBC 4.75  4.22 - 5.81 MIL/uL   Hemoglobin 14.9  13.0 - 17.0 g/dL   HCT 44.0  39.0 - 52.0 %   MCV 92.6  78.0 - 100.0 fL   MCH 31.4  26.0 - 34.0 pg   MCHC 33.9  30.0 - 36.0 g/dL   RDW 13.1  11.5 - 15.5 %   Platelets 192  150 - 400 K/uL   Neutrophils Relative % 85 (*) 43 - 77 %   Neutro Abs 10.4 (*) 1.7 - 7.7 K/uL   Lymphocytes Relative 9 (*)  12 - 46 %   Lymphs Abs 1.1  0.7 - 4.0 K/uL   Monocytes Relative 6  3 - 12 %   Monocytes Absolute 0.7  0.1 - 1.0 K/uL   Eosinophils Relative 0  0 - 5 %   Eosinophils Absolute 0.0  0.0 - 0.7 K/uL   Basophils Relative 0  0 - 1 %   Basophils Absolute 0.0  0.0 - 0.1 K/uL  BASIC METABOLIC PANEL     Status: Abnormal   Collection Time    06/21/13  1:45 PM      Result Value Ref Range   Sodium 137  137 - 147 mEq/L   Potassium 4.2  3.7 - 5.3 mEq/L   Chloride 99  96 - 112 mEq/L   CO2 24  19 - 32 mEq/L   Glucose, Bld 126 (*) 70 - 99 mg/dL   BUN 15  6 - 23 mg/dL   Creatinine, Ser 0.65  0.50 - 1.35 mg/dL   Calcium 9.3  8.4 - 10.5 mg/dL   GFR calc non Af Amer >90  >90 mL/min  GFR calc Af Amer >90  >90 mL/min   Comment: (NOTE)     The eGFR has been calculated using the CKD EPI equation.     This calculation has not been validated in all clinical situations.     eGFR's persistently <90 mL/min signify possible Chronic Kidney     Disease.   Dg Finger Index Left  06/21/2013   CLINICAL DATA:  Pain and swelling secondary to trauma.  EXAM: LEFT INDEX FINGER 2+V  COMPARISON:  Radiograph dated 05/13/2013  FINDINGS: There is a comminuted angulated distracted fracture of the proximal phalanx of the index finger. There is a spiral component of the fractured extending toward the base.  IMPRESSION: Comminuted fracture of the proximal phalanx of the index finger.   Electronically Signed   By: Rozetta Nunnery M.D.   On: 06/21/2013 14:16    Review of Systems  Constitutional: Negative.   HENT: Negative.   Respiratory: Negative.   Gastrointestinal: Negative.   Genitourinary: Negative.   Neurological: Negative.   Endo/Heme/Allergies: Negative.     Blood pressure 146/90, pulse 89, temperature 99.3 F (37.4 C), temperature source Oral, resp. rate 16, height _0  (1.803 m), weight 76.204 kg (168 lb), SpO2 95.00%. Physical Exam  Left index finger has obvious deformity. Left index finger has loss of blood flow.  There is no meaningful capillary refill to the finger. Unfortunately the finger is nonviable at this moment. The patient has a proximal phalanx fracture.  The main aspect of his examination is within normal limits.  He does have a pinpoint puncture about the a finger. We will need to explore this.  The patient is alert and oriented in no acute distress the patient complains of pain in the affected upper extremity.  The patient is noted to have a normal HEENT exam.  Lung fields show equal chest expansion and no shortness of breath  abdomen exam is nontender without distention.  Lower extremity examination does not show any fracture dislocation or blood clot symptoms.  Pelvis is stable neck and back are stable and nontender Assessment/Plan We are going to plan for stabilization of his fracture and open look at the vascularity to see if there is something we can do to restore function and blood flow. I discussed him risk and benefits timeframe duration of recovery and the high risk of an amputation associated with these injuries  Will proceed to the OR on an urgent basis.  I discussed him all issues.  I would give him a  poor prognosis at this juncture  We are planning surgery for your upper extremity. The risk and benefits of surgery include risk of bleeding infection anesthesia damage to normal structures and failure of the surgery to accomplish its intended goals of relieving symptoms and restoring function with this in mind we'll going to proceed. I have specifically discussed with the patient the pre-and postoperative regime and the does and don'ts and risk and benefits in great detail. Risk and benefits of surgery also include risk of dystrophy chronic nerve pain failure of the healing process to go onto completion and other inherent risks of surgery The relavent the pathophysiology of the disease/injury process, as well as the alternatives for treatment and postoperative course of action  has been discussed in great detail with the patient who desires to proceed.  We will do everything in our power to help you (the patient) restore function to the upper extremity. Is a pleasure to see this patient today.  Gwyndolyn Saxon  Kary Sugrue 06/21/2013, 6:21 PM

## 2013-06-22 NOTE — Progress Notes (Signed)
Subjective: 1 Day Post-Op Procedure(s) (LRB): Incision and Drainage with Exploration left index finger , partial nail plate removal Percutaneous Fixation Left Index Finger (Left) Patient reports pain as controlled, no complaints today.States he is sleepy, denies nausea,vomiting, fever, chills.  Objective: Vital signs in last 24 hours: Temp:  [97.4 F (36.3 C)-99.3 F (37.4 C)] 97.4 F (36.3 C) (04/29 0458) Pulse Rate:  [54-114] 55 (04/29 0458) Resp:  [8-18] 18 (04/29 0458) BP: (119-146)/(66-97) 129/66 mmHg (04/29 0458) SpO2:  [93 %-100 %] 99 % (04/29 0458) Weight:  [76.204 kg (168 lb)] 76.204 kg (168 lb) (04/28 1320)  Intake/Output from previous day: 04/28 0701 - 04/29 0700 In: 800 [I.V.:800] Out: -  Intake/Output this shift:     Recent Labs  06/21/13 1345  HGB 14.9    Recent Labs  06/21/13 1345  WBC 12.2*  RBC 4.75  HCT 44.0  PLT 192    Recent Labs  06/21/13 1345  NA 137  K 4.2  CL 99  CO2 24  BUN 15  CREATININE 0.65  GLUCOSE 126*  CALCIUM 9.3   No results found for this basename: LABPT, INR,  in the last 72 hours .Marland Kitchen.The patient is alert and oriented in no acute distress the patient complains of pain in the affected upper extremity.  The patient is noted to have a normal HEENT exam.  Lung fields show equal chest expansion and no shortness of breath  abdomen exam is nontender without distention.  Lower extremity examination does not show any fracture dislocation or blood clot symptoms.  Pelvis is stable neck and back are stable and nontender Evaluation of the left upper extremity: currently extremity is not elevated and there is no warm k pad applied as ordered, the digit is dusky and appears congested, with elevation and digital massage it improves but is still guarded, refill sluggish  Assessment/Plan: 1 Day Post-Op Procedure(s) (LRB): Incision and Drainage with Exploration left index finger , partial nail plate removal Percutaneous Fixation Left Index  Finger (Left) I have d/w patient and nursing staff IT IS CRITICAL the room temperature be maintained at 75 or greater, warm kpad to be applied elevate in mission sling and work on Therapist, occupationaldigital cpr 20x per hour as demonstrated. The patient understands to prognosis of the finger is very guarded at this juncture. We will continue close obs.  Sheran LawlessBrian L Mailynn Everly 06/22/2013, 1:17 PM

## 2013-06-22 NOTE — Op Note (Signed)
[name]:  Fritz, Erik                 ACCOUNT NO.:  [id number]  MEDICAL RECORD NO.:  [id number]  LOCATION:  5N30C                        FACILITY:  [facility]  PHYSICIAN:  Dionne AnoWilliam M. Yianna Tersigni, M.D.DATE OF BIRTH:  1985/10/08  DATE OF PROCEDURE: DATE OF DISCHARGE:                              OPERATIVE REPORT   PREOPERATIVE DIAGNOSIS:  Status post Ringer injury to the left index finger with acute open fracture of the proximal phalanx and a dysvascular finger (index finger), left hand.  POSTOPERATIVE DIAGNOSIS: Status post Ringer injury to the left index finger with acute open fracture of the proximal phalanx and a dysvascular finger (index finger), left hand.  PROCEDURE: 1. Open reduction and internal fixation, left index finger proximal     phalanx fracture. 2. I and D open fracture.  This was an excisional debridement of an     open fracture, left index finger. 3. Partial nail plate removal, left index finger. 4. Wound exploration left index finger. 5. AP and lateral as well as oblique x-rays (4 view x-ray series),     left index finger.  SURGEON:  Dionne AnoWilliam M. Amanda PeaGramig, M.D.  ASSISTANT:  None.  COMPLICATION:  None.  ANESTHESIA:  General.  TOURNIQUET TIME:  Zero minutes.  The tourniquet was not inflated.  INDICATIONS:  This patient is a pleasant ringer injury today with acute fracture.  This happened around 1 p.m.  The emergency room staff saw, blocked the finger, placed him in traction, and after placing him in traction.  I evaluated the patient as well as Wynona NeatBuchanan, PA-C and we immediately booked him for surgery given the disarray, ringer injury, and dysvascular in nature to the finger.  I discussed with the patient the high probability of digit loss.  The risks and benefits of surgery do's and don'ts et Karie Sodacetera. With this in mind, the patient understood and desired to proceed.  Unfortunately, the patient has significant injury loss of vascular integrity and disarray of his  finger.  I have discussed with him risks and benefits, do's and don'ts et Karie Sodacetera.  OPERATION IN DETAIL:  The patient was seen by myself and Anesthesia, taken to operative suite, underwent smooth induction of general anesthetic.  He was prepped and draped in usual sterile fashion.  Once this was complete, we then isolated the sterile field.  I should note that I personally performed this prepped and draped, as I did not want any more/excessive manipulation of the hand given the nature of his dysvascularity.  Following this, we then very carefully performed I and D of skin and subcutaneous tissue.  His dorsal wound was evaluated.  The tendon was intact.  The tenodesis effect of the extensor and flexor apparatus appeared to be normal.  I exposed the fracture site and performed an excisional debridement and I and D.  Following this, the fracture was reduced.  I was extremely careful with soft tissue handling during all points and parts of this.  I did not want excessive manipulation. Following this, the fracture was reduced and I performed a Eaton-Belsky pinning technique.  A 0.045 K-wire was introduced in the proximal aspect of the proximal phalanx just at the MCP joint through a  percutaneous incision, and following this, it was placed intramedullary through the area in question.  This was done without difficulty.  Similar procedure was performed ulnarly.  Thus, ORIF was performed with a Eaton-Belsky pinning technique.  There was no to very minimal soft tissue disruption except for simply entering the skin.  Following this, I then performed a limited volar exploration.  I did not completely expose neurovascular bundles as I felt there were in spasm from the ringer injury and did not feel that excessive exposure, excessive stripping, or dissection would dramatically change his outcome at this point.  He had some scant type refill.  At this juncture, I removed half of his nail plate, and  there was some degree of refill that was appreciated.  This was albeit not normal, but given the fact that he had significant time frame duration from injury and significant disarray of the fracture prior to my presentation.  I felt the best course would be to warm the finger and place him on anticoagulants and give the finger chance to recover from excessive spasm and likely segmental ringer type injury phenomenon.  I discussed this with his family of course.  Unfortunately, there was a high risk of amputation with this type injury and they understand this.  I dressed him with Xeroform after the pins were cut, clipped, and bent as well as Adaptic and a volar plaster splint.  I performed all dressing placement and also waited until the patient woke up, holding the arm to make sure there was no manipulation of the finger or other issues.  I discussed all issues with his family.  We will have him admitted for IV antibiotics, dextran, aspirin, warm room precautions, and other measures to try to keep the area warm.  I have discussed with the patient these issues, do's and don'ts et Karie Sodacetera.  I would given variable prognosis and I feel that we simply have to wait and see how he does and let the finger to clear itself.  It was a pleasure to see him today.     Dionne AnoWilliam M. Amanda PeaGramig, M.D.     [facility]/MEDQ  D:  06/21/2013  T:  06/22/2013  Job:  [id number]

## 2013-06-22 NOTE — Progress Notes (Signed)
Orthopedic Tech Progress Note Patient Details:  [name] 03-08-1985 [id number]  Ortho Devices Type of Ortho Device: Other (comment) Ortho Device/Splint Location: applied Kuzam arm sling  Kuzam elevating arm sling to left upper extremity. Patient is educated on the wear and care of the device. He understands and agrees. He will contact nursing staff if he has any questions or concerns.   [name] 06/22/2013, 8:54 AM

## 2013-06-22 NOTE — Progress Notes (Signed)
Physical Therapy Note  Physical therapist spoke with occupational therapist after OT evaluation. OT states pt is functioning well and does not identify any functional mobility deficits at this time for physical therapy to treat. PT is signing-off at this time. Thank you for this referral.  Charlsie MerlesLogan Secor Maitlyn Penza, PT [phone]

## 2013-06-22 NOTE — Evaluation (Signed)
Occupational Therapy Evaluation Patient Details Name: [name] MRN: [id number] DOB: 04-Jul-1985 Today's Date: 06/22/2013    History of Present Illness 28 y.o. s/p Incision and Drainage with Exploration left index finger , partial nail plate removal Percutaneous Fixation Left Index Finger (Left)   Clinical Impression   Pt s/p above procedure. Education provided during session. No further OT needs.     Follow Up Recommendations  No OT follow up    Equipment Recommendations  None recommended by OT    Recommendations for Other Services       Precautions / Restrictions Precautions Precaution Comments: no pushing pulling lifting with LUE Restrictions Weight Bearing Restrictions: Yes LUE Weight Bearing: Weight bear through elbow only      Mobility Bed Mobility Overal bed mobility: Modified Independent                Transfers Overall transfer level: Modified independent Equipment used: None                  Balance                                            ADL Overall ADL's : Needs assistance/impaired                     Lower Body Dressing: Minimal assistance;Sit to/from stand   Toilet Transfer: Modified Independent;Ambulation;Regular Toilet             General ADL Comments: pt able to sit EOB and don/doff socks with Rt hand. Educated pt that pullover shirts may be easier to avoid having to button with one hand, but pt states he is able to do that one handed. Educated on precautions of no lifting,pulling, pushing and to elevate LUE. Pt states Dr. Amanda PeaGramig told him to use heat for RUE-heating pad applied at end of session along with mission sling. Told pt that elastic shoe laces may be handy.     Vision                     Perception     Praxis      Pertinent Vitals/Pain Pain 4/10. Elevated/placed in mission sling.     Hand Dominance Left   Extremity/Trunk Assessment Upper Extremity Assessment Upper  Extremity Assessment: RUE deficits/detail RUE: Unable to fully assess due to immobilization RUE Sensation: decreased light touch   Lower Extremity Assessment Lower Extremity Assessment: Overall WFL for tasks assessed       Communication Communication Communication: No difficulties   Cognition Arousal/Alertness: Awake/alert Behavior During Therapy: WFL for tasks assessed/performed Overall Cognitive Status: Within Functional Limits for tasks assessed                     General Comments       Exercises       Shoulder Instructions      Home Living Family/patient expects to be discharged to:: Private residence Living Arrangements: Children;Other relatives;Spouse/significant other (mother in law) Available Help at Discharge: Family Type of Home: Mobile home Home Access: Ramped entrance     Home Layout: One level     Bathroom Shower/Tub: Chief Strategy OfficerTub/shower unit   Bathroom Toilet: Standard                Prior Functioning/Environment Level of Independence: Independent  OT Diagnosis:     OT Problem List:     OT Treatment/Interventions:      OT Goals(Current goals can be found in the care plan section)    OT Frequency:     Barriers to D/C:            Co-evaluation              End of Session Nurse Communication: Other (comment) (pt in mission sling with heat pad in it)  Activity Tolerance: Patient tolerated treatment well Patient left: in bed;with call bell/phone within reach   Time: 0851-0902 OT Time Calculation (min): 11 min Charges:  OT General Charges $OT Visit: 1 Procedure OT Evaluation $Initial OT Evaluation Tier I: 1 Procedure G-CodesEarlie Raveling:    Lenore Moyano L Bentlee Drier OTR/L [phone] 06/22/2013, 10:36 AM

## 2013-06-23 ENCOUNTER — Encounter (HOSPITAL_COMMUNITY): Payer: Self-pay | Admitting: Orthopedic Surgery

## 2013-06-23 MED ORDER — DEXTRAN 40 IN D5W 10 % IV SOLN
10.0000 mL/kg | Freq: Once | INTRAVENOUS | Status: AC
  Administered 2013-06-23: 762 mL via INTRAVENOUS
  Filled 2013-06-23: qty 1000

## 2013-06-23 MED ORDER — HEPARIN SOD (PORK) LOCK FLUSH 100 UNIT/ML IV SOLN
500.0000 [IU] | INTRAVENOUS | Status: DC
  Administered 2013-06-24 (×3): 500 [IU] via INTRAVENOUS
  Filled 2013-06-23 (×11): qty 5

## 2013-06-23 NOTE — Progress Notes (Signed)
Patient ID: [name], male   DOB: August 06, 1985, 28 y.o.   MRN: [id number] Patient seen and examined at bedside Patient is doing fairly well. He has minimal to no pain I have noted it with the initiation of digital CPR as well as heating and blood thinning measures the finger has maintained a degree of viability which is leading towards an enthusiastic posture by myself.  I would recommend additional dextran L. M.D. 40 for 24 hours as well as continued massage to the finger and heating. The patient understands this.  I've asked nursing staff to place a heparin-soaked sponge over the fingertip (exposed nailbed) every 4 hours. At present time is doing fairly well. We'll continue IV antibiotics and other measures.  Is still a guarded prognosis we are going to continue an aggressive approach to his digit salvage.  I have spent a great deal time discussing this with him and going over all issues  Nursing staff is aware of all orders Owen Pratte M.D.

## 2013-06-23 NOTE — Op Note (Signed)
[name], [name]                 ACCOUNT NO.:  [id number]  MEDICAL RECORD NO.:  [id number]  LOCATION:  5N30C                        FACILITY:  [facility]  PHYSICIAN:  Dionne AnoWilliam M. Adalberto Metzgar, M.D.DATE OF BIRTH:  Jan 11, 1986  DATE OF PROCEDURE:  06/23/2013 DATE OF DISCHARGE:                              OPERATIVE REPORT   ADDENDUM:  The patient had fluoroscopy performed as well as AP, lateral, and oblique images of his finger at the time of surgery.  I performed the x-rays myself and interpreted the x-rays myself showing excellent position of the fracture fragments.  Full view 4 series radiographs including obliques were taken without difficulty, and there were no complicating features.     Dionne AnoWilliam M. Amanda PeaGramig, M.D.     [facility]  D:  06/23/2013  T:  06/23/2013  Job:  [id number]

## 2013-06-24 MED ORDER — SENNA 8.6 MG PO TABS
1.0000 | ORAL_TABLET | Freq: Two times a day (BID) | ORAL | Status: AC
Start: 2013-06-24 — End: ?

## 2013-06-24 MED ORDER — ASCORBIC ACID 1000 MG PO TABS: 1000.0000 mg | ORAL_TABLET | Freq: Every day | ORAL | Status: AC

## 2013-06-24 MED ORDER — OXYCODONE HCL 5 MG PO TABS: 5.0000 mg | ORAL_TABLET | ORAL | Status: AC | PRN

## 2013-06-24 MED ORDER — CEPHALEXIN 500 MG PO CAPS: 500.0000 mg | ORAL_CAPSULE | Freq: Four times a day (QID) | ORAL | Status: AC

## 2013-06-24 MED ORDER — ASPIRIN 325 MG PO TABS: 325.0000 mg | ORAL_TABLET | Freq: Two times a day (BID) | ORAL | Status: AC

## 2013-06-24 NOTE — Discharge Instructions (Signed)
Please take an aspirin 325 mg twice a day  Please come to the office at 11 AM Monday for your followup  Please continue to pump your finger 10 pumps every half-hour while your awake  Please call for any problems  Please keep your bandage clean and dry and the please keep your upper extremity warm   Please take vitamin C 1000 mg a day to promote healing. Please take vitamin B6 200 mg a day to promote nerve recovery Please take Peri-Colace still softener to prevent constipation  You may use the pain medicine as needed.   Please avoid excessive caffeine and do not use any form of tobacco

## 2013-06-24 NOTE — Discharge Summary (Signed)
Physician Discharge Summary  Patient ID: [name] MRN: [id number] DOB/AGE: 30-Oct-1985 28 y.o.  Admit date: 06/21/2013 Discharge date: 06/24/2013  Admission Diagnoses: Ringer injury the index finger left hand with associated fracture process  Discharge Diagnoses: Same Active Problems:   Fracture of proximal phalanx of left hand   Discharged Condition: fair  Hospital Course: Patient was admitted for anti-coagulation observation and other measures status post ringer injury treated with reconstruction. At time of discharge he had reasonable refill to the finger no complications and maintain viability throughout his hospital course. He will be discharged and followed very closely. He'll be out of work until further notice.  He tolerated regular diet Is voiding well He has no complications to date no signs of DVT  Finger is viable  Consults: None  Significant Diagnostic Studies: labs: See  Treatments: surgery: See op note  Discharge Exam: Blood pressure 132/93, pulse 57, temperature 98 F (36.7 C), temperature source Oral, resp. rate 18, height 5\' 11"  (1.803 m), weight 76.204 kg (168 lb), SpO2 97.00%. General appearance: alert, cooperative and appears stated age The patient is alert and oriented in no acute distress the patient complains of pain in the affected upper extremity.  The patient is noted to have a normal HEENT exam.  Lung fields show equal chest expansion and no shortness of breath  abdomen exam is nontender without distention.  Lower extremity examination does not show any fracture dislocation or blood clot symptoms.  Pelvis is stable neck and back are stable and nontender  Disposition: 01-Home or Self Care     Medication List    ASK your doctor about these medications       omeprazole 20 MG capsule  Commonly known as:  PRILOSEC  Take 20 mg by mouth daily.           Follow-up Information   Follow up with [name]. (Please come to my  office Monday at 11 AM for your followup)    Specialty:  Orthopedic Surgery   Contact information:   [address] [place] [address] [phone]       Signed: Dominica SeverinWilliam Joi Fritz 06/24/2013, 11:11 AM

## 2015-04-06 IMAGING — CR DG FINGER INDEX 2+V*L*
3 series · 3 of 3 positions shown · non-contrast
Comparison: Radiograph dated 05/13/2013

CLINICAL DATA: Pain and swelling secondary to trauma.

EXAM:
LEFT INDEX FINGER 2+V

[x finger pa left]
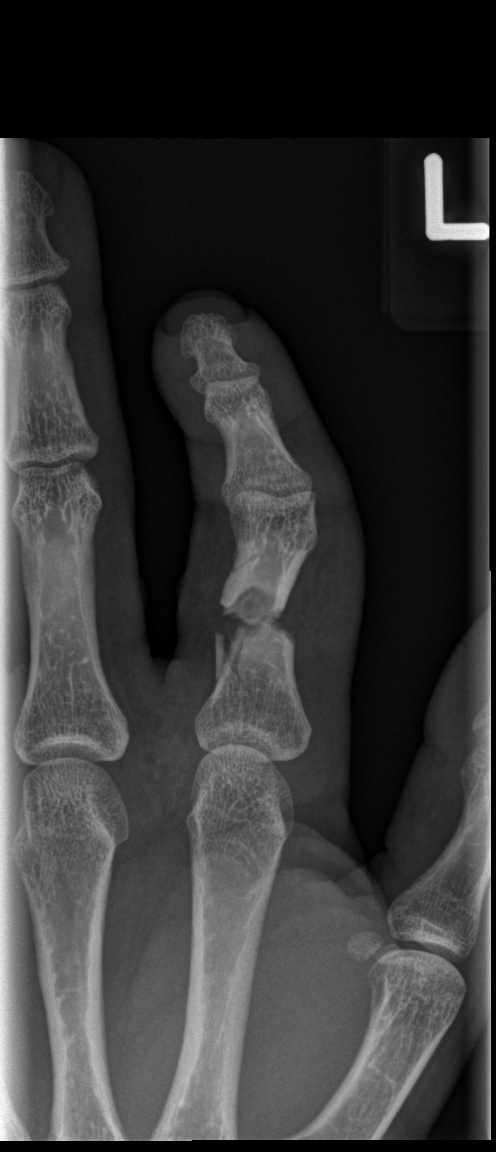

[x finger obl left]
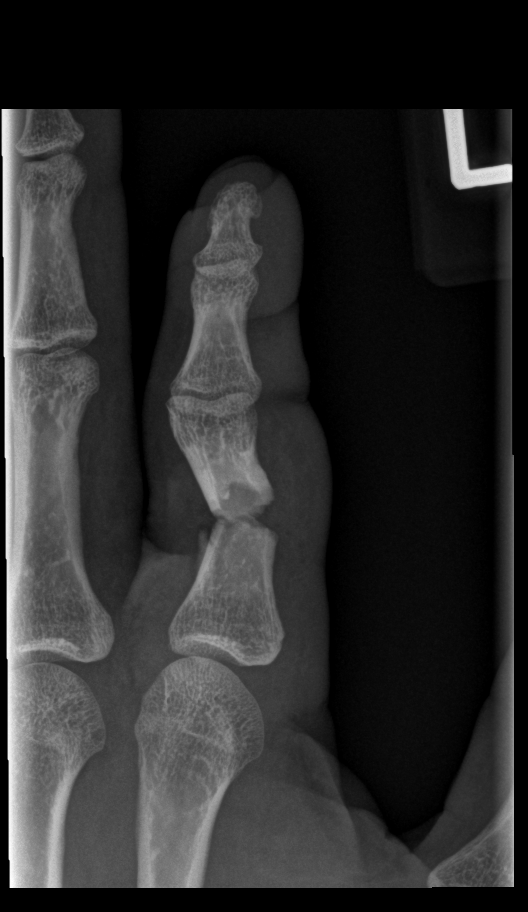

[x finger lat left]
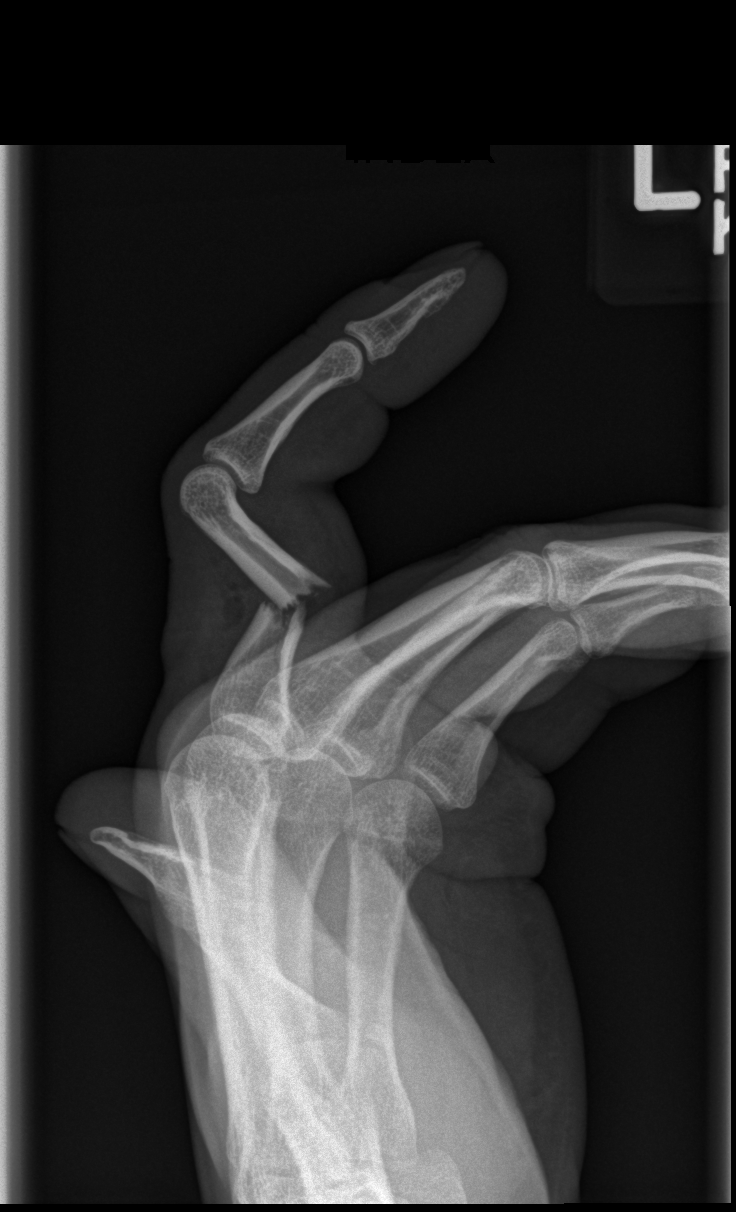

[3 of 3 positions shown; findings below may reference images not displayed]

FINDINGS: There is a comminuted angulated distracted fracture of the proximal
phalanx of the index finger. There is a spiral component of the
fractured extending toward the base.
IMPRESSION: Comminuted fracture of the proximal phalanx of the index finger.
